# Patient Record
Sex: Male | Born: 1955 | Race: White | Hispanic: No | Marital: Married | Smoking: Never smoker
Health system: Southern US, Community
[De-identification: ages and names within clinical notes are randomized; demographics above are authoritative.]

---

## 2009-12-28 ENCOUNTER — Ambulatory Visit: Payer: Self-pay | Admitting: Diagnostic Radiology

## 2009-12-28 ENCOUNTER — Emergency Department (HOSPITAL_BASED_OUTPATIENT_CLINIC_OR_DEPARTMENT_OTHER): Admission: EM | Admit: 2009-12-28 | Discharge: 2009-12-28 | Payer: Self-pay | Admitting: Emergency Medicine

## 2009-12-31 ENCOUNTER — Ambulatory Visit (HOSPITAL_COMMUNITY): Admission: RE | Admit: 2009-12-31 | Discharge: 2009-12-31 | Payer: Self-pay | Admitting: Urology

## 2010-09-12 LAB — CBC
HCT: 45.1 % (ref 39.0–52.0)
Hemoglobin: 15.3 g/dL (ref 13.0–17.0)
MCH: 29.1 pg (ref 26.0–34.0)
MCHC: 33.9 g/dL (ref 30.0–36.0)
Platelets: 176 10*3/uL (ref 150–400)
RBC: 5.25 MIL/uL (ref 4.22–5.81)
RDW: 12.9 % (ref 11.5–15.5)

## 2010-09-12 LAB — COMPREHENSIVE METABOLIC PANEL
ALT: 52 U/L (ref 0–53)
AST: 35 U/L (ref 0–37)
BUN: 13 mg/dL (ref 6–23)
Calcium: 9.3 mg/dL (ref 8.4–10.5)
Chloride: 105 mEq/L (ref 96–112)
Total Bilirubin: 0.9 mg/dL (ref 0.3–1.2)

## 2010-09-12 LAB — DIFFERENTIAL: Monocytes Absolute: 0.5 10*3/uL (ref 0.1–1.0)

## 2010-09-12 LAB — URINE MICROSCOPIC-ADD ON

## 2010-09-12 LAB — URINALYSIS, ROUTINE W REFLEX MICROSCOPIC
Bilirubin Urine: NEGATIVE
Nitrite: NEGATIVE

## 2011-09-09 IMAGING — CT CT ABD-PELV W/ CM
2 of 4 series · 16 of 46 positions shown, 18 images · IV contrast (APPLIED)
Comparison: None

CLINICAL DATA: Abdominal pain.  Left lower quadrant pain.

CT ABDOMEN AND PELVIS WITH CONTRAST
TECHNIQUE: Multidetector CT imaging of the abdomen and pelvis was
performed following the standard protocol during bolus
administration of intravenous contrast.
Contrast: 100 ml Smnipaque-XGG

[Series 2: abd/pelvis 5.0 b31f · axial · 0.81mm/px · z∈[-450,-10]mm · 13 of 98 slices shown, 15 images]
[im 5/98  soft-tissue]
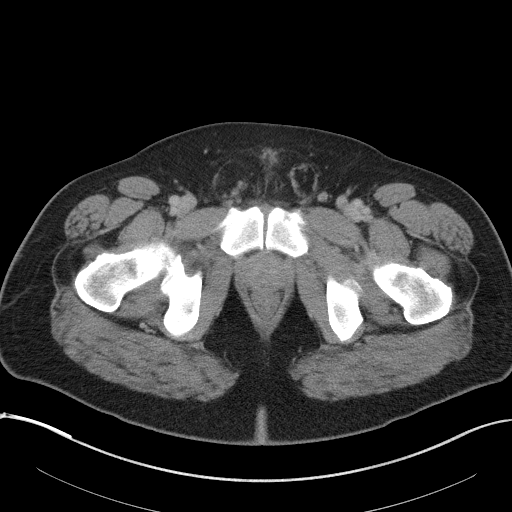
[im 5/98  bone]
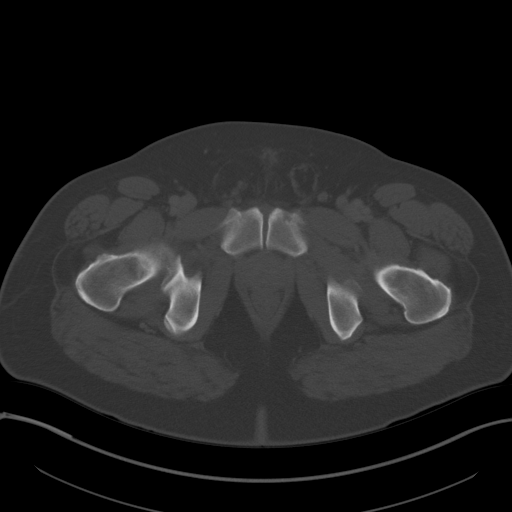
[im 13/98  soft-tissue]
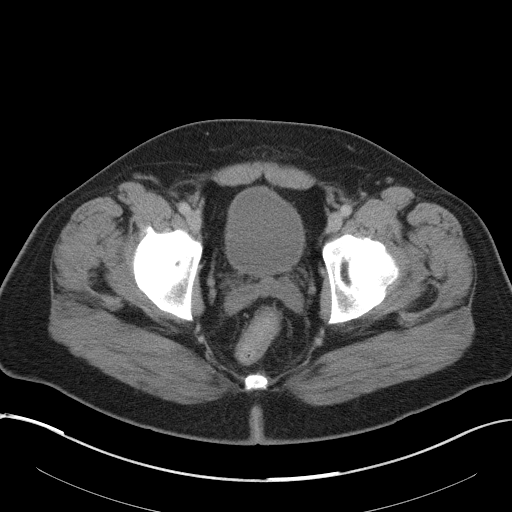
[im 22/98  soft-tissue]
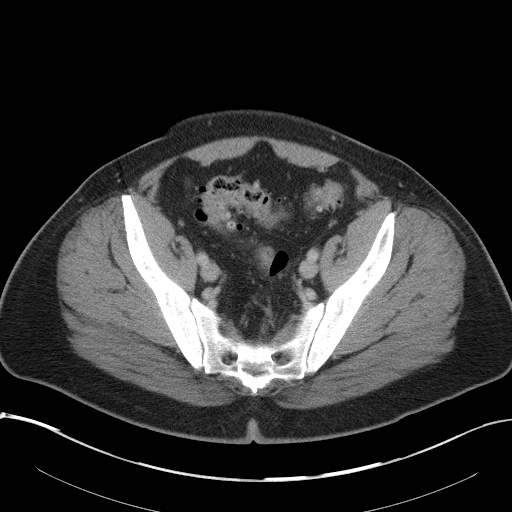
[im 26/98  soft-tissue]
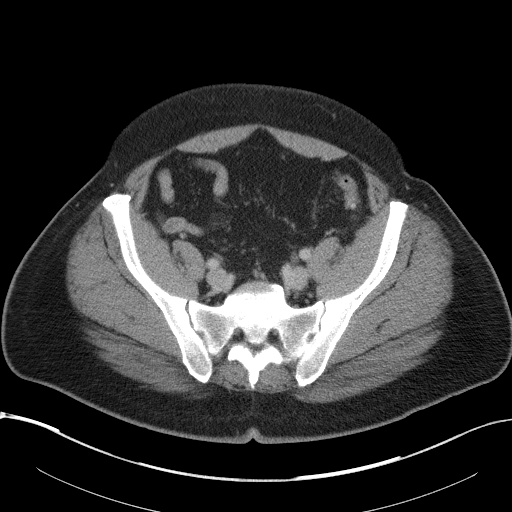
[im 34/98  soft-tissue]
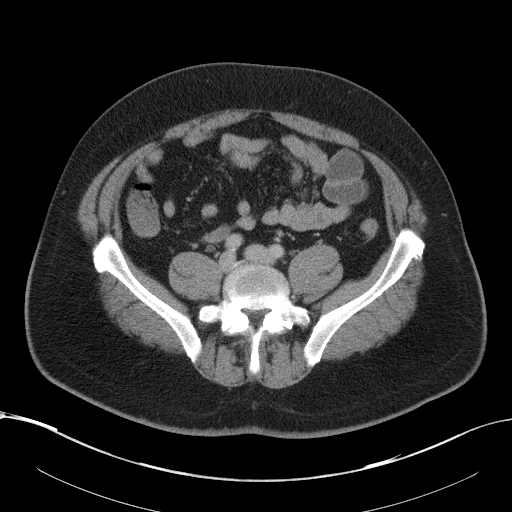
[im 43/98  soft-tissue]
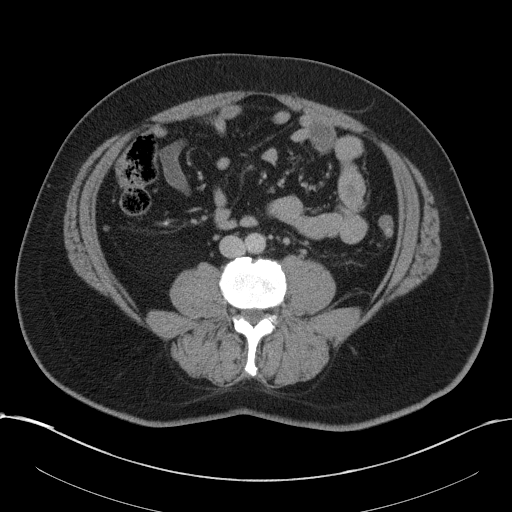
[im 51/98  soft-tissue]
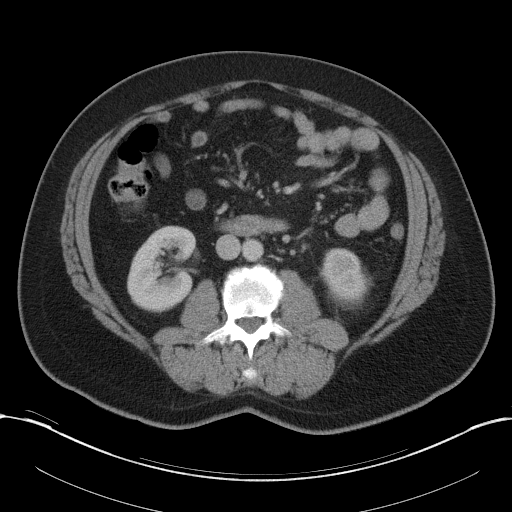
[im 55/98  soft-tissue]
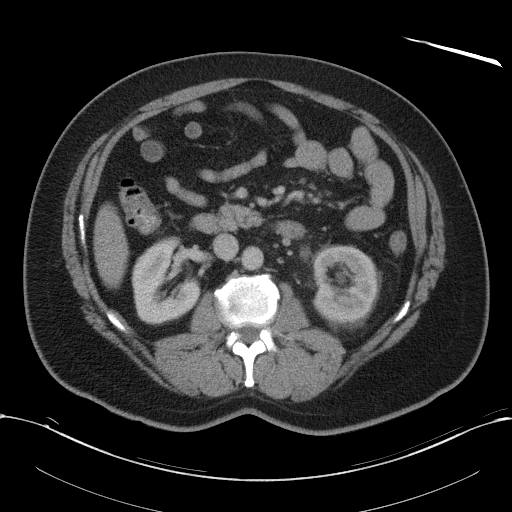
[im 64/98  soft-tissue]
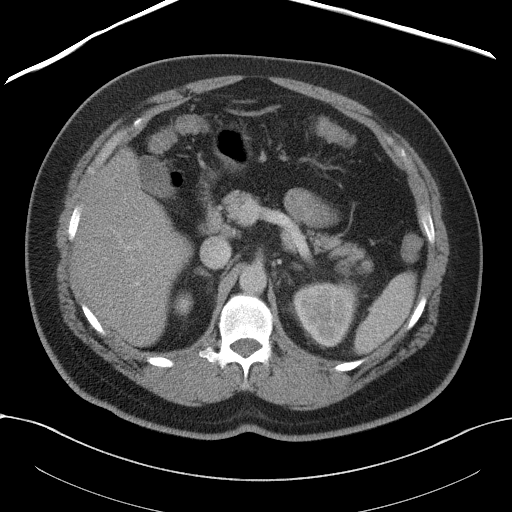
[im 64/98  bone]
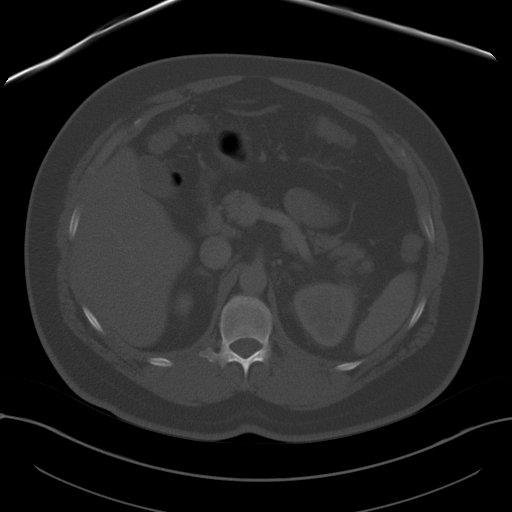
[im 72/98  soft-tissue]
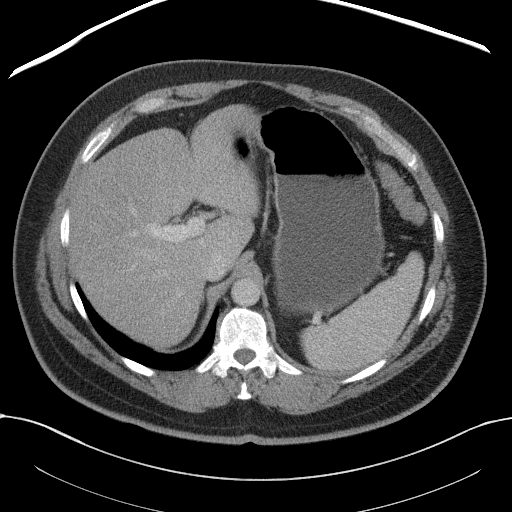
[im 76/98  soft-tissue]
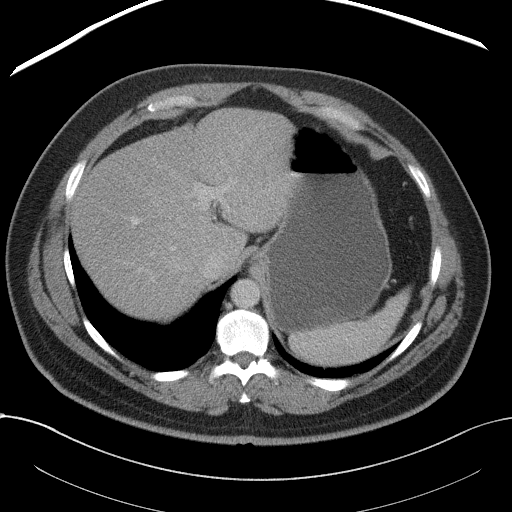
[im 85/98  soft-tissue]
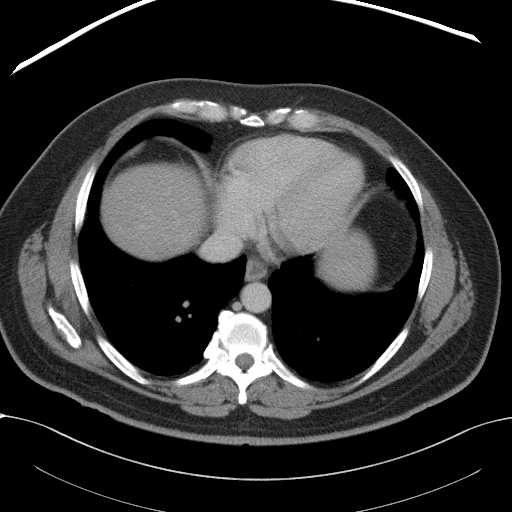
[im 93/98  soft-tissue]
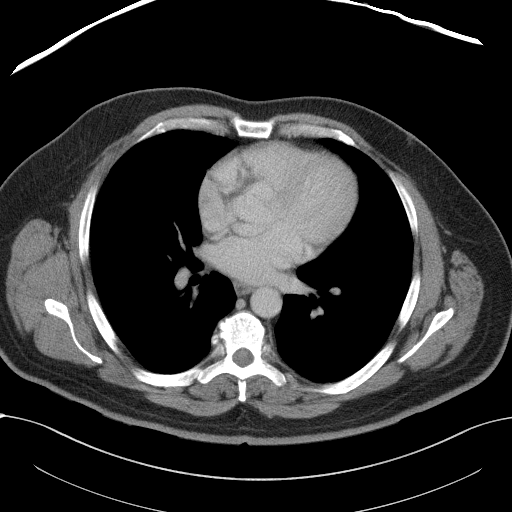

[Series 5: abd/pelvis 3.0 coronal · coronal · 0.75mm/px · 3 of 101 slices shown]
[im 34/101  soft-tissue]
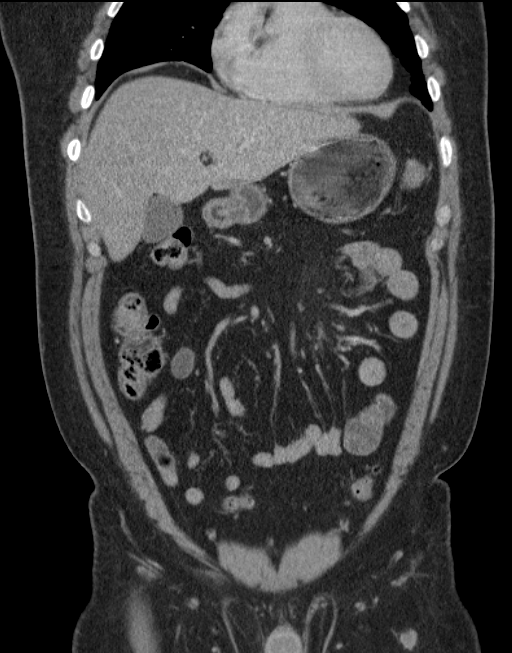
[im 45/101  soft-tissue]
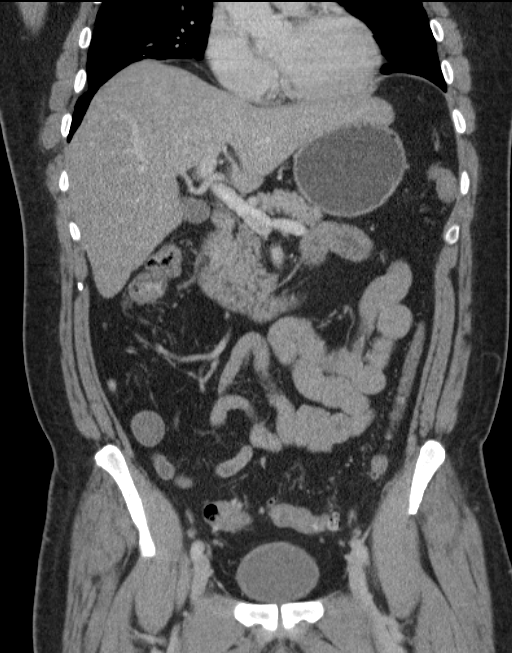
[im 56/101  soft-tissue]
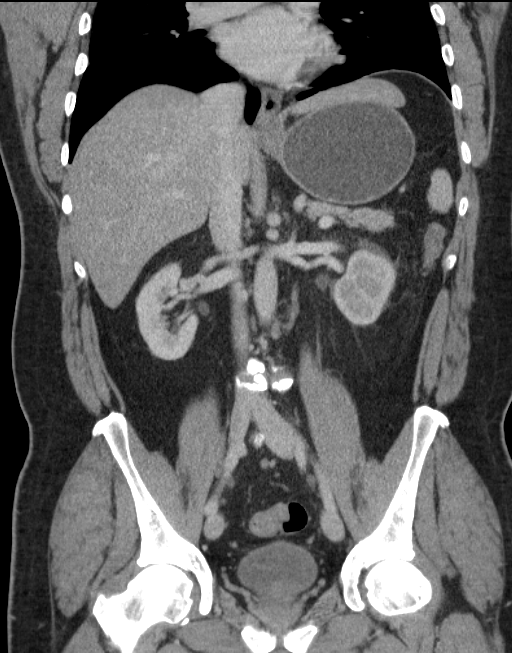

[16 of 46 positions shown; findings below may reference images not displayed]

FINDINGS: There is minimal linear atelectasis or scar in the left
lower lobe.  No pleural or pericardial fluid.  The liver shows mild
diffuse fatty change.  No focal lesion or biliary ductal
dilatation.  The spleen is normal.  The pancreas is normal.  The
adrenal glands are normal.  The aorta and IVC are normal.  The
right kidney is normal.

There is a 9 mm stone within the proximal left ureter at the level
of the lower pole the kidney with left-sided hydronephrosis.

The appendix is normal.  The patient has diverticulosis of the
colon but I do not see evidence of diverticulitis.  No free fluid
in the pelvis.  Bladder, prostate gland and seminal vesicles are
unremarkable.
IMPRESSION: 9 mm stone in the proximal left ureter with left-sided
hydronephrosis.

Sigmoid diverticulosis without evidence of diverticulitis.

## 2017-04-23 ENCOUNTER — Encounter (HOSPITAL_BASED_OUTPATIENT_CLINIC_OR_DEPARTMENT_OTHER): Payer: Self-pay | Admitting: Emergency Medicine

## 2017-04-23 ENCOUNTER — Emergency Department (HOSPITAL_BASED_OUTPATIENT_CLINIC_OR_DEPARTMENT_OTHER)
Admission: EM | Admit: 2017-04-23 | Discharge: 2017-04-23 | Disposition: A | Discharge status: Home / Self Care | Payer: Managed Care, Other (non HMO) | Attending: Emergency Medicine | Admitting: Emergency Medicine

## 2017-04-23 ENCOUNTER — Emergency Department (HOSPITAL_BASED_OUTPATIENT_CLINIC_OR_DEPARTMENT_OTHER): Payer: Managed Care, Other (non HMO)

## 2017-04-23 DIAGNOSIS — L089 Local infection of the skin and subcutaneous tissue, unspecified: Secondary | ICD-10-CM

## 2017-04-23 DIAGNOSIS — Y93E5 Activity, floor mopping and cleaning: Secondary | ICD-10-CM | POA: Insufficient documentation

## 2017-04-23 DIAGNOSIS — W458XXA Other foreign body or object entering through skin, initial encounter: Secondary | ICD-10-CM | POA: Diagnosis not present

## 2017-04-23 DIAGNOSIS — Y999 Unspecified external cause status: Secondary | ICD-10-CM | POA: Diagnosis not present

## 2017-04-23 DIAGNOSIS — S61240A Puncture wound with foreign body of right index finger without damage to nail, initial encounter: Secondary | ICD-10-CM | POA: Insufficient documentation

## 2017-04-23 DIAGNOSIS — R2232 Localized swelling, mass and lump, left upper limb: Secondary | ICD-10-CM | POA: Diagnosis present

## 2017-04-23 DIAGNOSIS — Y929 Unspecified place or not applicable: Secondary | ICD-10-CM | POA: Insufficient documentation

## 2017-04-23 DIAGNOSIS — S60450A Superficial foreign body of right index finger, initial encounter: Secondary | ICD-10-CM

## 2017-04-23 MED ORDER — IBUPROFEN 600 MG PO TABS: 600.0000 mg | ORAL_TABLET | Freq: Four times a day (QID) | ORAL | 0 refills | Status: AC | PRN

## 2017-04-23 MED ORDER — TRAMADOL HCL 50 MG PO TABS
ORAL_TABLET | ORAL | 0 refills | Status: AC
Start: 2017-04-23 — End: ?

## 2017-04-23 MED ORDER — CEPHALEXIN 500 MG PO CAPS: 1000.0000 mg | ORAL_CAPSULE | Freq: Two times a day (BID) | ORAL | 0 refills | Status: AC

## 2017-04-23 NOTE — ED Triage Notes (Signed)
Swelling to L index finger, reports he thinks there is a piece of a wooden splinter still in the finger tip that he cannot see. It has been there since Thursday.

## 2017-04-23 NOTE — ED Provider Notes (Signed)
[facility] EMERGENCY DEPARTMENT Provider Note   CSN: [id number] Arrival date & time: 04/23/17  1219     History   Chief Complaint Chief Complaint  Patient presents with  . Foreign Body in Skin    HPI Lawrence Zimmerman is a 61 y.o. male.  HPI Patient was cleaning a windowsill 3 days ago and got a large splinter in the left index finger.  He was able to pull most of it out.  He continues to however have pain and swelling and suspects there may be a small amount of retained splinter. History reviewed. No pertinent past medical history.  There are no active problems to display for this patient.   History reviewed. No pertinent surgical history.     Home Medications    Prior to Admission medications   Medication Sig Start Date End Date Taking? Authorizing Provider  cephALEXin (KEFLEX) 500 MG capsule Take 2 capsules (1,000 mg total) by mouth 2 (two) times daily. 04/23/17   Arby BarrettePfeiffer, Julious Langlois, MD  ibuprofen (ADVIL,MOTRIN) 600 MG tablet Take 1 tablet (600 mg total) by mouth every 6 (six) hours as needed. 04/23/17   Arby BarrettePfeiffer, Delray Reza, MD  traMADol (ULTRAM) 50 MG tablet 1-2 tablets every 6 hours for pain not relieved by ibuprofen. 04/23/17   Arby BarrettePfeiffer, Amiel Sharrow, MD    Family History No family history on file.  Social History Social History  Substance Use Topics  . Smoking status: Never Smoker  . Smokeless tobacco: Never Used  . Alcohol use No     Allergies   Patient has no known allergies.   Review of Systems Review of Systems Constitutional: No fever no chills no general illness Respiratory: No cough no shortness of breath  Physical Exam Updated Vital Signs BP (!) 169/94 (BP Location: Right Arm)   Pulse 79   Temp 98.4 F (36.9 C) (Oral)   Resp 18   Ht 5\' 8"  (1.727 m)   Wt 108.9 kg (240 lb)   SpO2 99%   BMI 36.49 kg/m   Physical Exam  Constitutional: He is oriented to person, place, and time. He appears well-developed and well-nourished. No distress.    HENT:  Head: Normocephalic and atraumatic.  Eyes: EOM are normal.  Neck: Neck supple.  Pulmonary/Chest: Effort normal.  Musculoskeletal: Normal range of motion. He exhibits tenderness.  Patient has tender approximately 4 mm area on the left index finger to the medial side of the finger pad.  There is no palpable fluctuance.  Mild diffuse swelling.  I cannot palpate or visualize a foreign body.  Full range of motion of digit.  See attached image.  Neurological: He is alert and oriented to person, place, and time. No cranial nerve deficit. He exhibits normal muscle tone. Coordination normal.  Skin: Skin is warm and dry.  Psychiatric: He has a normal mood and affect.           ED Treatments / Results  Labs (all labs ordered are listed, but only abnormal results are displayed) Labs Reviewed - No data to display  EKG  EKG Interpretation None       Radiology Dg Finger Index Left  Result Date: 04/23/2017 CLINICAL DATA:  Pain and swelling distal phalanx, splinter? EXAM: LEFT INDEX FINGER 2+V COMPARISON:  None. FINDINGS: Osseous alignment is normal. No fracture line or displaced fracture fragment. No cortical irregularity or osseous lesion. No radiodense foreign body appreciated within the adjacent soft tissues. IMPRESSION: Negative.  No foreign body appreciated within the soft tissues.  Electronically Signed   By: Bary Richard M.D.   On: 04/23/2017 13:31    Procedures Procedures (including critical care time)  Medications Ordered in ED Medications - No data to display   Initial Impression / Assessment and Plan / ED Course  I have reviewed the triage vital signs and the nursing notes.  Pertinent labs & imaging results that were available during my care of the patient were reviewed by me and considered in my medical decision making (see chart for details).       Final Clinical Impressions(s) / ED Diagnoses   Final diagnoses:  Foreign body of right index finger with  infection   Patient has an area where he had a splinter 3 days ago.  It is very tender and he suspects retained foreign body.  I am unable to visualize any thing externally or palpate fluctuance that I think is appropriate for incision and drainage.  Did use an ultrasound to try to identify any foreign body or fluid collection which I could not see.  Findings are fairly subtle at this time although clearly he does have an area of tenderness where the splinter occurred.  There is an blind incision and drainage proceed with antibiotics and recheck.  It is possible he has just some deeper subcutaneous infection from the effect of puncture wound.  Patient is aware of the follow-up plan and possible subsequent incision and drainage of fluctuance or increased swelling should develop. New Prescriptions New Prescriptions   CEPHALEXIN (KEFLEX) 500 MG CAPSULE    Take 2 capsules (1,000 mg total) by mouth 2 (two) times daily.   IBUPROFEN (ADVIL,MOTRIN) 600 MG TABLET    Take 1 tablet (600 mg total) by mouth every 6 (six) hours as needed.   TRAMADOL (ULTRAM) 50 MG TABLET    1-2 tablets every 6 hours for pain not relieved by ibuprofen.     Arby Barrette, MD 04/23/17 [phone]

## 2017-04-23 NOTE — ED Notes (Signed)
Patient transported to X-ray 

## 2019-01-03 IMAGING — CR DG FINGER INDEX 2+V*L*
3 series · 3 of 3 positions shown · non-contrast
Comparison: None.

CLINICAL DATA: Pain and swelling distal phalanx, splinter?

EXAM:
LEFT INDEX FINGER 2+V

[x finger pa left]
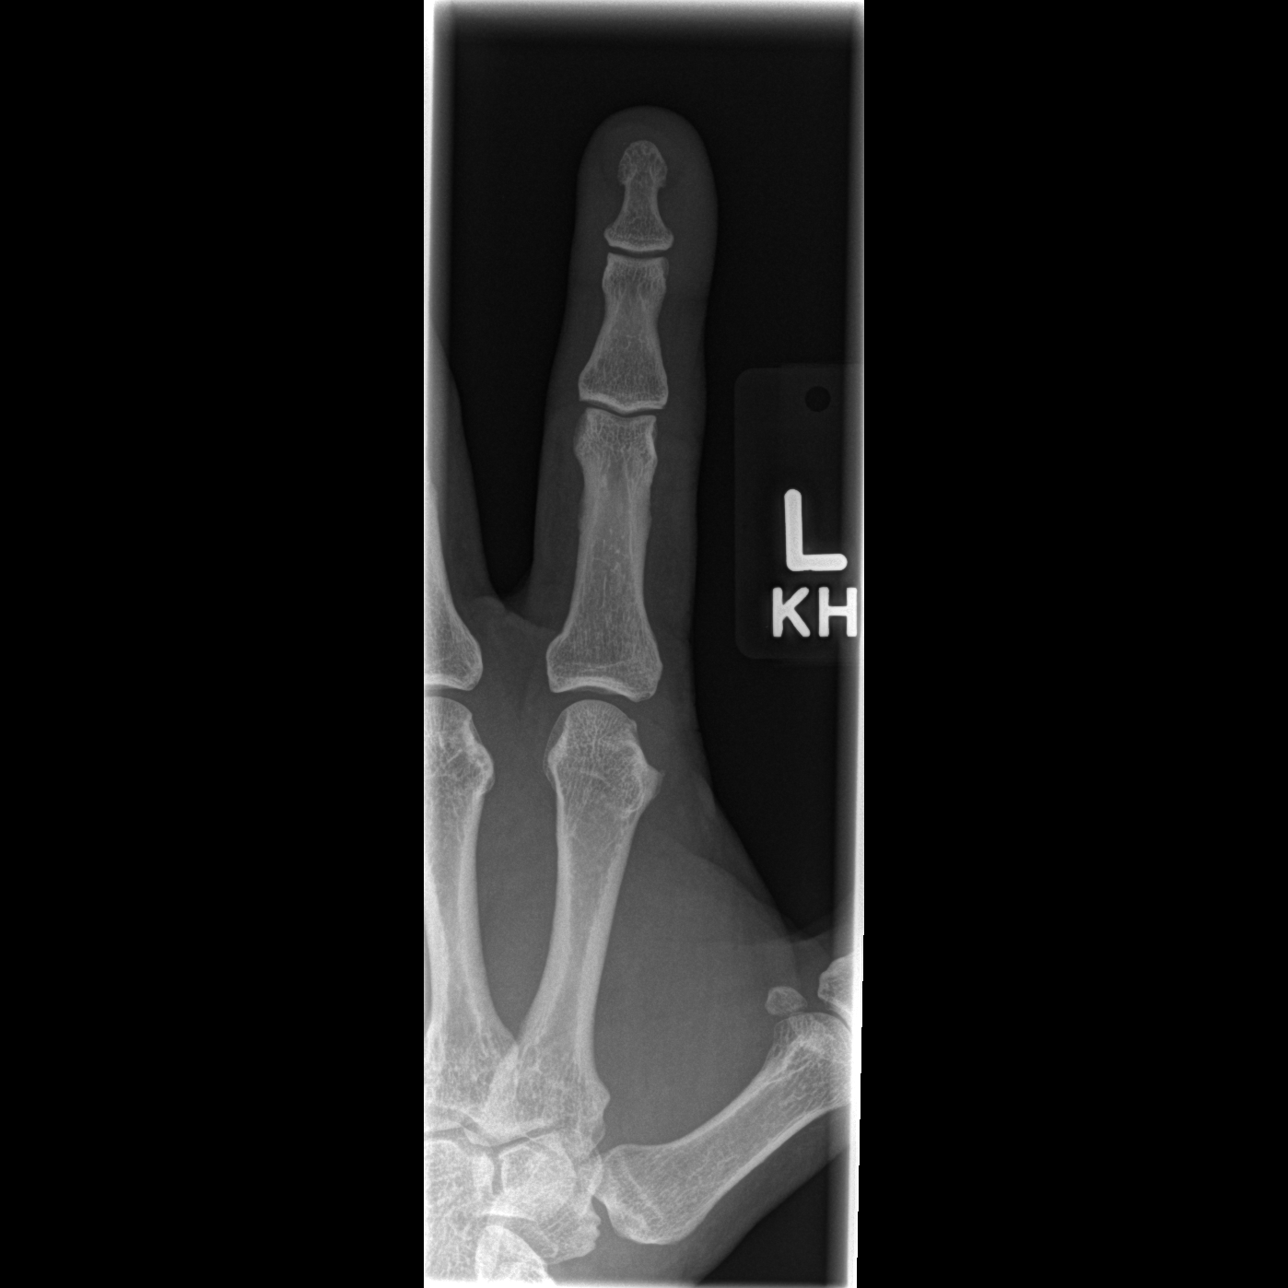

[x finger obl. left]
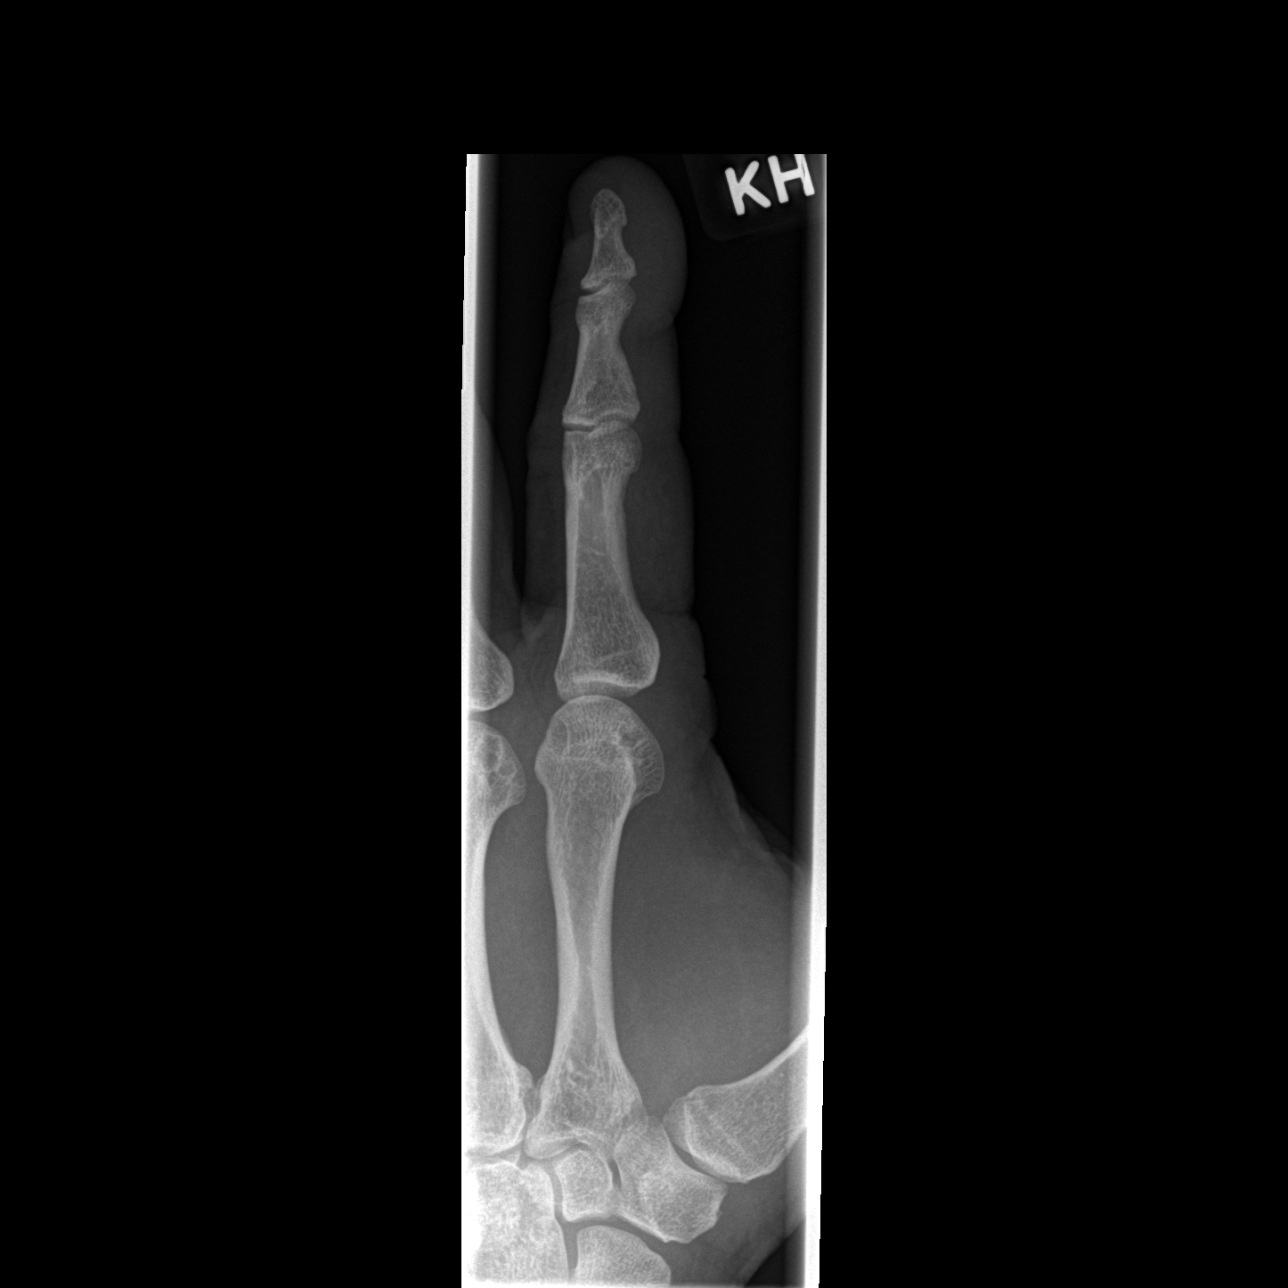

[x finger lateral left]
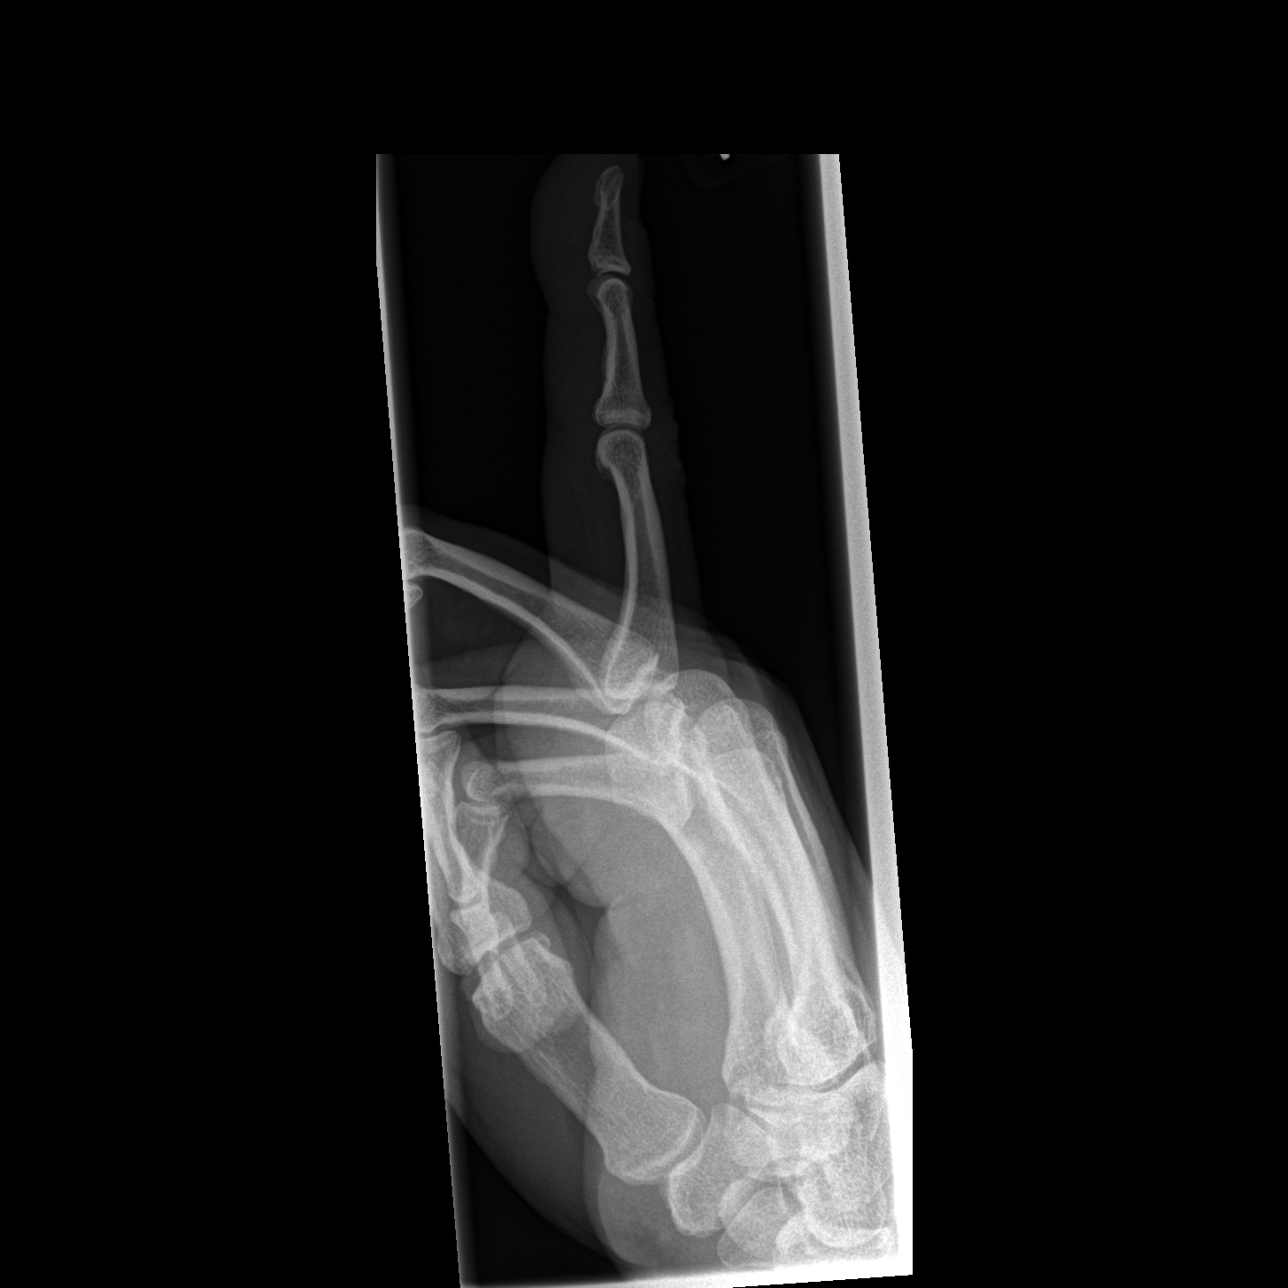

[3 of 3 positions shown; findings below may reference images not displayed]

FINDINGS: Osseous alignment is normal. No fracture line or displaced fracture
fragment. No cortical irregularity or osseous lesion. No radiodense
foreign body appreciated within the adjacent soft tissues.
IMPRESSION: Negative.  No foreign body appreciated within the soft tissues.
